# Patient Record
Sex: Male | Born: 1995 | Hispanic: No | Marital: Single | Smoking: Never smoker
Health system: Southern US, Community
[De-identification: ages and names within clinical notes are randomized; demographics above are authoritative.]

---
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
## 2016-01-12 ENCOUNTER — Encounter (HOSPITAL_COMMUNITY): Payer: Self-pay | Admitting: Emergency Medicine

## 2016-01-12 ENCOUNTER — Emergency Department (HOSPITAL_COMMUNITY)
Admission: EM | Admit: 2016-01-12 | Discharge: 2016-01-12 | Disposition: A | Discharge status: Home / Self Care | Payer: Worker's Compensation | Attending: Emergency Medicine | Admitting: Emergency Medicine

## 2016-01-12 DIAGNOSIS — Y929 Unspecified place or not applicable: Secondary | ICD-10-CM | POA: Diagnosis not present

## 2016-01-12 DIAGNOSIS — S0181XA Laceration without foreign body of other part of head, initial encounter: Secondary | ICD-10-CM | POA: Insufficient documentation

## 2016-01-12 DIAGNOSIS — Y990 Civilian activity done for income or pay: Secondary | ICD-10-CM | POA: Diagnosis not present

## 2016-01-12 DIAGNOSIS — Y939 Activity, unspecified: Secondary | ICD-10-CM | POA: Diagnosis not present

## 2016-01-12 DIAGNOSIS — W270XXA Contact with workbench tool, initial encounter: Secondary | ICD-10-CM | POA: Insufficient documentation

## 2016-01-12 MED ORDER — LIDOCAINE HCL 2 % IJ SOLN
10.0000 mL | Freq: Once | INTRAMUSCULAR | Status: AC
  Administered 2016-01-12: 20 mg
  Filled 2016-01-12: qty 20

## 2016-01-12 MED ORDER — BACITRACIN ZINC 500 UNIT/GM EX OINT
TOPICAL_OINTMENT | Freq: Two times a day (BID) | CUTANEOUS | Status: DC
  Administered 2016-01-12: 1 via TOPICAL

## 2016-01-12 MED ORDER — TETANUS-DIPHTH-ACELL PERTUSSIS 5-2.5-18.5 LF-MCG/0.5 IM SUSP
0.5000 mL | Freq: Once | INTRAMUSCULAR | Status: AC
  Administered 2016-01-12: 0.5 mL via INTRAMUSCULAR
  Filled 2016-01-12: qty 0.5

## 2016-01-12 NOTE — ED Triage Notes (Signed)
Pt reports forehead laceration at work . sts got hit by screwdriver while trying to pull it out of cover . Presents with two small lacerations on forehead ,,bleeding controled. Alert and oriented x 4

## 2016-01-12 NOTE — ED Provider Notes (Signed)
[facility]-EMERGENCY DEPT Provider Note   CSN: [id number] Arrival date & time: 01/12/16  1555  By signing my name below, I, Vista Minkobert Ross, attest that this documentation has been prepared under the direction and in the presence of H&R BlockJeffrey Edynn Gillock PA-C.  Electronically Signed: Vista Minkobert Ross, ED Scribe. 01/12/16. 8:02 PM.  History   Chief Complaint Chief Complaint  Patient presents with  . Facial Laceration    HPI HPI Comments: Elijah Gonzalez is a 20 y.o. male who presents to the Emergency Department s/p an injury that occurred just prior to arrival. 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 He has two small wounds above his left eye and right eye, bleeding currently controlled. Pt is unsure of his last Tetanus. No changes in vision. No associated symptoms.  HPI  History reviewed. No pertinent past medical history.  There are no active problems to display for this patient.   History reviewed. No pertinent surgical history.     Home Medications    Prior to Admission medications   Medication Sig Start Date End Date Taking? Authorizing Provider  acetaminophen (TYLENOL) 500 MG tablet Take 500 mg by mouth every 6 (six) hours as needed (for pain).    Historical Provider, MD  hyoscyamine (LEVSIN/SL) 0.125 MG SL tablet Place 1 tablet (0.125 mg total) under the tongue every 4 (four) hours as needed. Patient not taking: Reported on 07/17/2014 06/12/14   Elpidio AnisShari Upstill, PA-C  ibuprofen (ADVIL,MOTRIN) 800 MG tablet Take 1 tablet (800 mg total) by mouth 3 (three) times daily. 07/18/14   Hanna Patel-Mills, PA-C  loperamide (IMODIUM) 2 MG capsule Take 2-4 mg by mouth 4 (four) times daily as needed for diarrhea or loose stools.    Historical Provider, MD  ondansetron (ZOFRAN) 4 MG tablet Take 1 tablet (4 mg total) by mouth every 6 (six) hours. Patient not taking: Reported on 07/17/2014 06/12/14   Elpidio AnisShari Upstill, PA-C    Family History No  family history on file.  Social History Social History  Substance Use Topics  . Smoking status: Never Smoker  . Smokeless tobacco: Never Used  . Alcohol use No     Allergies   Patient has no known allergies.   Review of Systems Review of Systems A complete 10 system review of systems was obtained and all systems are negative except as noted in the HPI and PMH.    Physical Exam Updated Vital Signs BP 147/70   Pulse 80   Temp 98.1 F (36.7 C) (Oral)   Resp 16   Ht 5\' 5"  (1.651 m)   Wt 70.3 kg   SpO2 100%   BMI 25.79 kg/m   Physical Exam  Constitutional: He is oriented to person, place, and time. He appears well-developed and well-nourished.  HENT:  Head: Normocephalic and atraumatic.  Eyes: Conjunctivae are normal. Pupils are equal, round, and reactive to light. Right eye exhibits no discharge. Left eye exhibits no discharge. No scleral icterus.  Neck: Normal range of motion. No JVD present. No tracheal deviation present.  Pulmonary/Chest: Effort normal. No stridor.  Neurological: He is alert and oriented to person, place, and time. Coordination normal.  Skin:  2 small lacerations to the forehead each approximately 0.5 cm, no surrounding bony abnormality  Psychiatric: He has a normal mood and affect. His behavior is normal. Judgment and thought content normal.  Nursing note and vitals reviewed.    ED Treatments /  Results  DIAGNOSTIC STUDIES: Oxygen Saturation is 97% on RA, normal by my interpretation.  COORDINATION OF CARE: 5:00 PM-Discussed treatment plan with pt at bedside and pt agreed to plan.   Labs (all labs ordered are listed, but only abnormal results are displayed) Labs Reviewed - No data to display  EKG  EKG Interpretation None       Radiology No results found.  Procedures Procedures (including critical care time)  LACERATION REPAIR Performed by: Thermon LeylandHedges,Leyani Gargus Todd Authorized by: Thermon LeylandHedges,Satchel Heidinger Todd Consent: Verbal consent  obtained. Risks and benefits: risks, benefits and alternatives were discussed Consent given by: patient Patient identity confirmed: provided demographic data Prepped and Draped in normal sterile fashion Wound explored  Laceration Location: forehead  Laceration Length- 2 lacerations each .5cm  No Foreign Bodies seen or palpated  Anesthesia: local infiltration  Local anesthetic: lidocaine 2% Anesthetic total: 1 ml  Irrigation method: syringe Amount of cleaning: standard  Skin closure: simple  Number of sutures: first laceration two 5.0 prolene sutures, second one 5.0 prolene suture  Technique: simple interrupted   Patient tolerance: Patient tolerated the procedure well with no immediate complications.  Medications Ordered in ED Medications  bacitracin ointment (1 application Topical Given 01/12/16 1809)  lidocaine (XYLOCAINE) 2 % (with pres) injection 200 mg (20 mg Infiltration Given 01/12/16 1732)  Tdap (BOOSTRIX) injection 0.5 mL (0.5 mLs Intramuscular Given 01/12/16 1707)     Initial Impression / Assessment and Plan / ED Course  I have reviewed the triage vital signs and the nursing notes.  Pertinent labs & imaging results that were available during my care of the patient were reviewed by me and considered in my medical decision making (see chart for details).  Clinical Course     Patient presents with 2 lacerations to his fore head. Tetanus is not up-to-date, given tetanus here, lacerations repaired here. No other injuries noted. Instructed follow-up in 5-7 days for suture removal, wound care instructions given, strict return precautions given. Patient verbalized understanding and agreement to today's plan  Final Clinical Impressions(s) / ED Diagnoses   Final diagnoses:  Facial laceration, initial encounter    New Prescriptions Discharge Medication List as of 01/12/2016  5:49 PM    I personally performed the services described in this documentation, which was  scribed in my presence. The recorded information has been reviewed and is accurate.   Eyvonne MechanicJeffrey Morrie Daywalt, PA-C 01/12/16 2003    Donnetta HutchingBrian Cook, MD 01/18/16 [phone]

## 2016-01-12 NOTE — Discharge Instructions (Signed)
Please read attached information. If you experience any new or worsening signs or symptoms please return to the emergency room for evaluation. Please follow-up with your primary care provider or specialist as discussed.  °

## 2016-01-18 ENCOUNTER — Encounter (HOSPITAL_COMMUNITY): Payer: Self-pay

## 2016-01-18 ENCOUNTER — Emergency Department (HOSPITAL_COMMUNITY)
Admission: EM | Admit: 2016-01-18 | Discharge: 2016-01-18 | Disposition: A | Discharge status: Home / Self Care | Payer: Medicaid Other | Attending: Emergency Medicine | Admitting: Emergency Medicine

## 2016-01-18 DIAGNOSIS — Z79899 Other long term (current) drug therapy: Secondary | ICD-10-CM | POA: Insufficient documentation

## 2016-01-18 DIAGNOSIS — Z4802 Encounter for removal of sutures: Secondary | ICD-10-CM

## 2016-01-18 NOTE — ED Provider Notes (Signed)
[facility]-EMERGENCY DEPT Provider Note   CSN: [id number] Arrival date & time: 01/18/16  1447  By signing my name below, I, Soijett Blue, attest that this documentation has been prepared under the direction and in the presence of Buel ReamAlexandra Miyako Oelke, PA-C Electronically Signed: Soijett Blue, ED Scribe. 01/18/16. 3:06 PM.  History   Chief Complaint Chief Complaint  Patient presents with  . Suture / Staple Removal    HPI Elijah Gonzalez is a 20 y.o. male who presents to the Emergency Department complaining of suture removal onset today. Pt had the sutures placed to his forehead 8 days ago due to being accidentally struck in the forehead with a vice grip while at work. Pt notes that he is a [employer]. He has tried neosporin with relief for his symptoms. Denies drainage, color change, fever, chills, CP, abdominal pain, nausea, vomiting, and any other symptoms.   The history is provided by the patient. No language interpreter was used.    History reviewed. No pertinent past medical history.  There are no active problems to display for this patient.   History reviewed. No pertinent surgical history.    Home Medications    Prior to Admission medications   Medication Sig Start Date End Date Taking? Authorizing Provider  acetaminophen (TYLENOL) 500 MG tablet Take 500 mg by mouth every 6 (six) hours as needed (for pain).    Historical Provider, MD  hyoscyamine (LEVSIN/SL) 0.125 MG SL tablet Place 1 tablet (0.125 mg total) under the tongue every 4 (four) hours as needed. Patient not taking: Reported on 07/17/2014 06/12/14   Elpidio AnisShari Upstill, PA-C  ibuprofen (ADVIL,MOTRIN) 800 MG tablet Take 1 tablet (800 mg total) by mouth 3 (three) times daily. 07/18/14   Hanna Patel-Mills, PA-C  loperamide (IMODIUM) 2 MG capsule Take 2-4 mg by mouth 4 (four) times daily as needed for diarrhea or loose stools.    Historical Provider, MD  ondansetron (ZOFRAN) 4 MG tablet Take 1 tablet (4 mg total) by mouth every 6 (six)  hours. Patient not taking: Reported on 07/17/2014 06/12/14   Elpidio AnisShari Upstill, PA-C    Family History History reviewed. No pertinent family history.  Social History Social History  Substance Use Topics  . Smoking status: Never Smoker  . Smokeless tobacco: Never Used  . Alcohol use No     Allergies   Patient has no known allergies.   Review of Systems Review of Systems  Constitutional: Negative for chills and fever.  Cardiovascular: Negative for chest pain.  Gastrointestinal: Negative for abdominal pain, nausea and vomiting.  Skin: Positive for wound (healing lacerations to forehead). Negative for color change.       No drainage to affected areas     Physical Exam Updated Vital Signs BP 131/66 (BP Location: Left Arm)   Pulse 76   Temp 98.1 F (36.7 C) (Oral)   Resp 16   SpO2 100%   Physical Exam  Constitutional: He is oriented to person, place, and time. He appears well-developed and well-nourished. No distress.  HENT:  Head: Normocephalic. Head is with laceration.  2 small well healing lacerations to the forehead. No erythema, tenderness, or drainage.  Eyes: EOM are normal.  Neck: Neck supple.  Cardiovascular: Normal rate.   Pulmonary/Chest: Effort normal. No respiratory distress.  Abdominal: He exhibits no distension.  Musculoskeletal: Normal range of motion.  Neurological: He is alert and oriented to person, place, and time.  Skin: Skin is warm and dry.  Psychiatric: He has a normal mood and  affect. His behavior is normal.  Nursing note and vitals reviewed.    ED Treatments / Results  DIAGNOSTIC STUDIES: Oxygen Saturation is 100% on RA, nl by my interpretation.    COORDINATION OF CARE: 2:58 PM Discussed treatment plan with pt at bedside which includes suture removal and pt agreed to plan.   Procedures .Suture Removal Date/Time: 01/18/2016 2:59 PM Performed by: Emi HolesLAW, Sadao Weyer M Authorized by: Emi HolesLAW, Emmaus Brandi M   Consent:    Consent obtained:   Verbal   Consent given by:  Patient   Risks discussed:  Pain Location:    Location:  Head/neck   Head/neck location:  Forehead Procedure details:    Wound appearance:  No signs of infection   Number of sutures removed:  3 Post-procedure details:    Post-removal:  Antibiotic ointment applied   Patient tolerance of procedure:  Tolerated well, no immediate complications   (including critical care time)  Medications Ordered in ED Medications - No data to display   Initial Impression / Assessment and Plan / ED Course  I have reviewed the triage vital signs and the nursing notes.  Clinical Course      Pt to ER for suture removal and wound check as above. Procedure tolerated well. Vitals normal, no signs of infection. Scar minimization & return precautions given at dc.   Final Clinical Impressions(s) / ED Diagnoses   Final diagnoses:  Visit for suture removal    New Prescriptions Discharge Medication List as of 01/18/2016  3:05 PM      I personally performed the services described in this documentation, which was scribed in my presence. The recorded information has been reviewed and is accurate.     Emi Holeslexandra M Kayelynn Abdou, PA-C 01/18/16 1540    Lyndal Pulleyaniel Knott, MD 01/19/16 [phone]

## 2016-01-18 NOTE — ED Triage Notes (Signed)
Pt has returned for forehead suture removal. Pt denies pain. Wounds appear WNL.

## 2016-01-18 NOTE — Discharge Instructions (Signed)
Continue applying antibiotic ointment over the next few days until your wounds are completely healed. You can use an over-the-counter scar cream that includes coconut or shea butter to prevent scarring. Keep your scars covered when you are in the sun to prevent scarring as well. Please return to emergency department if you develop any new or worsening symptoms including fever, increasing pain, redness, swelling, drainage from the areas of wound.
